# Patient Record
Sex: Male | Born: 1995 | Race: Black or African American | Hispanic: No | Marital: Single | Smoking: Current some day smoker
Health system: Southern US, Community
[De-identification: ages and names within clinical notes are randomized; demographics above are authoritative.]

---

## 2020-06-15 ENCOUNTER — Encounter (HOSPITAL_COMMUNITY): Payer: Self-pay | Admitting: Emergency Medicine

## 2020-06-15 ENCOUNTER — Emergency Department (HOSPITAL_COMMUNITY)
Admission: EM | Admit: 2020-06-15 | Discharge: 2020-06-15 | Disposition: A | Discharge status: Home / Self Care | Payer: Self-pay | Attending: Emergency Medicine | Admitting: Emergency Medicine

## 2020-06-15 ENCOUNTER — Emergency Department (HOSPITAL_COMMUNITY): Payer: Self-pay

## 2020-06-15 DIAGNOSIS — Z5321 Procedure and treatment not carried out due to patient leaving prior to being seen by health care provider: Secondary | ICD-10-CM | POA: Insufficient documentation

## 2020-06-15 DIAGNOSIS — S61213A Laceration without foreign body of left middle finger without damage to nail, initial encounter: Secondary | ICD-10-CM | POA: Insufficient documentation

## 2020-06-15 DIAGNOSIS — W28XXXA Contact with powered lawn mower, initial encounter: Secondary | ICD-10-CM | POA: Insufficient documentation

## 2020-06-15 NOTE — ED Notes (Signed)
Called for room x5 

## 2020-06-15 NOTE — ED Notes (Signed)
Called for room x3 °

## 2020-06-15 NOTE — ED Triage Notes (Signed)
Pt here with a lac to the middle finger on the right hand with a mower , bleeding controlled

## 2020-06-15 NOTE — ED Triage Notes (Signed)
Emergency Medicine Provider Triage Evaluation Note  Christopher Ho , a 25 y.o. male  was evaluated in triage.  Pt complains of right 3rd finger lac from lawn mower. Bleeding controlled, last td less than 5 years. ambidextrous   Review of Systems  Positive: Finger lac Negative: antigoagulated  Physical Exam  BP (!) 141/91 (BP Location: Left Arm)   Pulse 72   Temp 98.1 F (36.7 C) (Oral)   Resp 16   SpO2 100%  Gen:   Awake, no distress   HEENT:  Atraumatic  Resp:  Normal effort  Cardiac:  Normal rate  Abd:   Nondistended, nontender  MSK:   Moves extremities without difficulty, lac to pad of distal right 3rd digit, sensation intact, normal ROM Neuro:  Speech clear   Medical Decision Making  Medically screening exam initiated at 7:12 PM.  Appropriate orders placed.  Christopher Ho was informed that the remainder of the evaluation will be completed by another provider, this initial triage assessment does not replace that evaluation, and the importance of remaining in the ED until their evaluation is complete.  Clinical Impression     Christopher Ho 06/15/20 1914

## 2020-06-16 ENCOUNTER — Other Ambulatory Visit: Payer: Self-pay

## 2020-06-16 ENCOUNTER — Emergency Department (HOSPITAL_COMMUNITY)
Admission: EM | Admit: 2020-06-16 | Discharge: 2020-06-16 | Disposition: A | Discharge status: Home / Self Care | Payer: Medicaid - Out of State | Attending: Emergency Medicine | Admitting: Emergency Medicine

## 2020-06-16 ENCOUNTER — Encounter (HOSPITAL_COMMUNITY): Payer: Self-pay | Admitting: Emergency Medicine

## 2020-06-16 DIAGNOSIS — F17200 Nicotine dependence, unspecified, uncomplicated: Secondary | ICD-10-CM | POA: Insufficient documentation

## 2020-06-16 DIAGNOSIS — Y93A6 Activity, grass drills: Secondary | ICD-10-CM | POA: Insufficient documentation

## 2020-06-16 DIAGNOSIS — S6991XA Unspecified injury of right wrist, hand and finger(s), initial encounter: Secondary | ICD-10-CM | POA: Diagnosis present

## 2020-06-16 DIAGNOSIS — S61212A Laceration without foreign body of right middle finger without damage to nail, initial encounter: Secondary | ICD-10-CM | POA: Diagnosis not present

## 2020-06-16 DIAGNOSIS — W28XXXA Contact with powered lawn mower, initial encounter: Secondary | ICD-10-CM | POA: Insufficient documentation

## 2020-06-16 MED ORDER — LIDOCAINE HCL (PF) 1 % IJ SOLN
30.0000 mL | Freq: Once | INTRAMUSCULAR | Status: AC
  Administered 2020-06-16: 30 mL
  Filled 2020-06-16: qty 30

## 2020-06-16 MED ORDER — CEPHALEXIN 500 MG PO CAPS: 500.0000 mg | ORAL_CAPSULE | Freq: Two times a day (BID) | ORAL | 0 refills | Status: AC

## 2020-06-16 NOTE — ED Provider Notes (Signed)
[facility]-EMERGENCY DEPT Provider Note   CSN: [id number] Arrival date & time: 06/16/20  1205     History No chief complaint on file.   Christopher Ho is a 25 y.o. male with no pertinent past medical history that presents to the emergency department today for finger laceration.  Patient states that he cut his right middle finger yesterday while mowing grass, states that he was able to continue mowing grass after he cut his finger.  Patient states that he is up-to-date on his tetanus shot.  Patient did not clean the wound out, states that he came last night but then left without being seen.  Bleeding of been controlled, is not on any blood thinners.  States that he cut himself with the lawnmower, did not go through the nail bed.  Did not injure himself elsewhere.  No numbness and tingling into his finger.  Normal range of motion to finger.  No other complaints at this time.Marland Kitchen  HPI     History reviewed. No pertinent past medical history.  There are no problems to display for this patient.   History reviewed. No pertinent surgical history.     No family history on file.  Social History   Tobacco Use  . Smoking status: Current Some Day Smoker    Home Medications Prior to Admission medications   Medication Sig Start Date End Date Taking? Authorizing Provider  cephALEXin (KEFLEX) 500 MG capsule Take 1 capsule (500 mg total) by mouth 2 (two) times daily for 5 days. 06/16/20 06/21/20 Yes Farrel Gordon, PA-C    Allergies    Shellfish allergy  Review of Systems   Review of Systems  Constitutional: Negative for diaphoresis, fatigue and fever.  Eyes: Negative for visual disturbance.  Respiratory: Negative for shortness of breath.   Cardiovascular: Negative for chest pain.  Gastrointestinal: Negative for nausea and vomiting.  Musculoskeletal: Negative for back pain and myalgias.  Skin: Positive for wound. Negative for color change, pallor and rash.   Neurological: Negative for syncope, weakness, light-headedness, numbness and headaches.  Psychiatric/Behavioral: Negative for behavioral problems and confusion.    Physical Exam Updated Vital Signs BP 132/90 (BP Location: Right Arm)   Pulse 74   Temp 98.3 F (36.8 C) (Oral)   Resp 18   Ht 6\' 3"  (1.905 m)   Wt 79.4 kg   SpO2 100%   BMI 21.87 kg/m   Physical Exam Constitutional:      General: He is not in acute distress.    Appearance: Normal appearance. He is not ill-appearing, toxic-appearing or diaphoretic.  Cardiovascular:     Rate and Rhythm: Normal rate and regular rhythm.     Pulses: Normal pulses.  Pulmonary:     Effort: Pulmonary effort is normal.     Breath sounds: Normal breath sounds.  Musculoskeletal:        General: Normal range of motion.       Hands:     Comments: 2 cm laceration to middle tip of finger, bleeding controlled. Normal cap refill. Superficial. Normal strength and ROM to DIP, PIP and MCP joint. Radial pulse 2+. No nailbed involvement.  Skin:    General: Skin is warm and dry.     Capillary Refill: Capillary refill takes less than 2 seconds.  Neurological:     General: No focal deficit present.     Mental Status: He is alert and oriented to person, place, and time.  Psychiatric:        Mood  and Affect: Mood normal.        Behavior: Behavior normal.        Thought Content: Thought content normal.     ED Results / Procedures / Treatments   Labs (all labs ordered are listed, but only abnormal results are displayed) Labs Reviewed - No data to display  EKG None  Radiology DG Finger Middle Right  Result Date: 06/15/2020 CLINICAL DATA:  Lawnmower injury EXAM: RIGHT MIDDLE FINGER 2+V COMPARISON:  None. FINDINGS: There is no evidence of fracture or dislocation. Cutaneous skin defect with edema along the distal aspect of the phalanx. No radiopaque foreign body. IMPRESSION: No acute fracture or dislocation. Cutaneous skin defect and edema along  the distal aspect of the phalanx, no radiopaque foreign body. Electronically Signed   By: Maudry Mayhew MD   On: 06/15/2020 19:53    Procedures .Marland KitchenLaceration Repair  Date/Time: 06/16/2020 12:20 PM Performed by: Farrel Gordon, PA-C Authorized by: Farrel Gordon, PA-C   Consent:    Consent obtained:  Verbal   Consent given by:  Patient   Risks discussed:  Infection, need for additional repair, pain, poor cosmetic result and poor wound healing   Alternatives discussed:  No treatment and delayed treatment Universal protocol:    Procedure explained and questions answered to patient or proxy's satisfaction: yes     Relevant documents present and verified: yes     Test results available: yes     Imaging studies available: yes     Required blood products, implants, devices, and special equipment available: yes     Site/side marked: yes     Immediately prior to procedure, a time out was called: yes     Patient identity confirmed:  Verbally with patient Anesthesia:    Anesthesia method:  Nerve block   Block anesthetic:  Lidocaine 1% w/o epi   Block technique:  Finger block   Block injection procedure:  Anatomic landmarks identified, introduced needle, anatomic landmarks palpated, negative aspiration for blood and incremental injection   Block outcome:  Anesthesia achieved Laceration details:    Location:  Finger   Finger location:  R long finger   Length (cm):  2   Depth (mm):  2 Pre-procedure details:    Preparation:  Patient was prepped and draped in usual sterile fashion Exploration:    Hemostasis achieved with:  Direct pressure   Wound exploration: wound explored through full range of motion and entire depth of wound visualized   Treatment:    Area cleansed with:  Povidone-iodine and soap and water   Amount of cleaning:  Extensive   Irrigation method:  Pressure wash and tap   Debridement:  None Skin repair:    Repair method:  Sutures   Suture size:  4-0   Suture material:   Prolene   Suture technique:  Simple interrupted   Number of sutures:  8 Approximation:    Approximation:  Close Repair type:    Repair type:  Simple Post-procedure details:    Dressing:  Open (no dressing)   Procedure completion:  Tolerated well, no immediate complications     Medications Ordered in ED Medications  lidocaine (PF) (XYLOCAINE) 1 % injection 30 mL (has no administration in time range)    ED Course  I have reviewed the triage vital signs and the nursing notes.  Christopher Ho is a 25 y.o. male with no pertinent past medical history that presents to the emergency department today for finger laceration.  Patient is distally  neurovascularly intact, bleeding controlled.  No tendon involvement.  Wound was irrigated very well.  Wound was repaired with 8 sutures, patient tolerated well.  Will place on antibiotics due to location of wound.  Symptomatic treatment discussed, patient will return for wound check/suture removal in the next couple of days.  Patient is up-to-date on tetanus.  Doubt need for further emergent work up at this time. I explained the diagnosis and have given explicit precautions to return to the ER including for any other new or worsening symptoms. The patient understands and accepts the medical plan as it's been dictated and I have answered their questions. Discharge instructions concerning home care and prescriptions have been given. The patient is STABLE and is discharged to home in good condition.   Final Clinical Impression(s) / ED Diagnoses Final diagnoses:  Laceration of right middle finger without damage to nail, foreign body presence unspecified, initial encounter    Rx / DC Orders ED Discharge Orders         Ordered    cephALEXin (KEFLEX) 500 MG capsule  2 times daily        06/16/20 1308           Farrel Gordon, PA-C 06/16/20 1310    Hurley, Madelaine Bhat, DO 06/16/20 1553

## 2020-06-16 NOTE — ED Notes (Signed)
Wound spray applied, wound cleaned. No nailbed involvement, only finger pad laceration.

## 2020-06-16 NOTE — ED Triage Notes (Signed)
Patient complains of a laceration to middle right finger cutting grass yesterday, stated that he is UTD on tetanus, got it last year.

## 2020-06-16 NOTE — Discharge Instructions (Addendum)
WOUND CARE Please return in7 days to have your stitches/staples removed or sooner if you have concerns.  Keep area clean and dry for 24 hours. Do not remove bandage, if applied.  After 24 hours, remove bandage and wash wound gently with mild soap and warm water. Reapply a new bandage after cleaning wound, if directed.  Continue daily cleansing with soap and water until stitches/staples are removed.  Do not apply any ointments or creams to the wound while stitches/staples are in place, as this may cause delayed healing.  Notify the office if you experience any of the following signs of infection: Swelling, redness, pus drainage, streaking, fever >101.0 F  Notify the office if you experience excessive bleeding that does not stop after 15-20 minutes of constant, firm pressure.       Please return to the Emergency Department if you experience any worsening of your condition.  Thank you for allowing [place] to be a part of your care. Please speak to your pharmacist about any new medications prescribed today in regards to side effects or interactions with other medications.

## 2020-06-24 ENCOUNTER — Other Ambulatory Visit: Payer: Self-pay

## 2020-06-24 ENCOUNTER — Emergency Department (HOSPITAL_COMMUNITY)
Admission: EM | Admit: 2020-06-24 | Discharge: 2020-06-24 | Disposition: A | Discharge status: Home / Self Care | Payer: Medicaid - Out of State | Attending: Emergency Medicine | Admitting: Emergency Medicine

## 2020-06-24 ENCOUNTER — Encounter (HOSPITAL_COMMUNITY): Payer: Self-pay | Admitting: Emergency Medicine

## 2020-06-24 DIAGNOSIS — L089 Local infection of the skin and subcutaneous tissue, unspecified: Secondary | ICD-10-CM | POA: Diagnosis not present

## 2020-06-24 DIAGNOSIS — S61212D Laceration without foreign body of right middle finger without damage to nail, subsequent encounter: Secondary | ICD-10-CM | POA: Insufficient documentation

## 2020-06-24 DIAGNOSIS — X58XXXD Exposure to other specified factors, subsequent encounter: Secondary | ICD-10-CM | POA: Diagnosis not present

## 2020-06-24 DIAGNOSIS — F17200 Nicotine dependence, unspecified, uncomplicated: Secondary | ICD-10-CM | POA: Insufficient documentation

## 2020-06-24 DIAGNOSIS — L7682 Other postprocedural complications of skin and subcutaneous tissue: Secondary | ICD-10-CM | POA: Diagnosis not present

## 2020-06-24 DIAGNOSIS — Z4802 Encounter for removal of sutures: Secondary | ICD-10-CM | POA: Diagnosis not present

## 2020-06-24 MED ORDER — DOXYCYCLINE HYCLATE 100 MG PO CAPS: 100.0000 mg | ORAL_CAPSULE | Freq: Two times a day (BID) | ORAL | 0 refills | Status: AC

## 2020-06-24 MED ORDER — LIDOCAINE HCL (PF) 1 % IJ SOLN
2.0000 mL | Freq: Once | INTRAMUSCULAR | Status: AC
  Administered 2020-06-24: 2 mL
  Filled 2020-06-24: qty 30

## 2020-06-24 NOTE — Discharge Instructions (Addendum)
Please cleanse wound with gentle soap and water daily and apply Neosporin over the wound for protection.  Take antibiotic as prescribed.  Keep a dressing over the wound to decrease risk of worsening infection.  Return if you have any concern

## 2020-06-24 NOTE — ED Provider Notes (Signed)
[facility]-EMERGENCY DEPT Provider Note   CSN: [id number] Arrival date & time: 06/24/20  1500     History No chief complaint on file.   Pistol Kessenich is a 25 y.o. male.  The history is provided by the patient and medical records. No language interpreter was used.     25 year old male presenting requesting for wound recheck.  Patient injured his right middle finger on 06/16/2020 while he was mowing the yard.  He stopped the next day to have wound care and laceration repaired.  He was discharged home, he noticed increasing swelling and pain to the affected area and also report that some of the sutures popped out.  Pain is sharp throbbing moderate intensity.  No fever chills or numbness.  He is right-hand dominant  No past medical history on file.  There are no problems to display for this patient.   No past surgical history on file.     No family history on file.  Social History   Tobacco Use  . Smoking status: Current Some Day Smoker    Home Medications Prior to Admission medications   Not on File    Allergies    Shellfish allergy  Review of Systems   Review of Systems  Constitutional: Negative for fever.  Skin: Positive for wound.    Physical Exam Updated Vital Signs BP 137/78 (BP Location: Left Arm)   Pulse (!) 46   Temp 99 F (37.2 C) (Oral)   Resp 16   SpO2 99%   Physical Exam Vitals and nursing note reviewed.  Constitutional:      General: He is not in acute distress.    Appearance: He is well-developed.  HENT:     Head: Atraumatic.  Eyes:     Conjunctiva/sclera: Conjunctivae normal.  Musculoskeletal:        General: Signs of injury (Right middle finger: There is a 2 cm laceration noted to the pad of the finger with several sutures in place however the wound is oozing discharge and is edematous.  Tender to palpation) present.     Cervical back: Neck supple.  Skin:    Findings: No rash.  Neurological:     Mental Status:  He is alert.     ED Results / Procedures / Treatments   Labs (all labs ordered are listed, but only abnormal results are displayed) Labs Reviewed - No data to display  EKG None  Radiology No results found.  Procedures Procedures   SUTURE REMOVAL Performed by: Fayrene Helper  Consent: Verbal consent obtained. Patient identity confirmed: provided demographic data Time out: Immediately prior to procedure a "time out" was called to verify the correct patient, procedure, equipment, support staff and site/side marked as required.  Location details: R middle finger, pad  Wound Appearance: edematous, tender, and infected with several sutures that are loose  Sutures/Staples Removed: sutures removed  Facility: sutures placed in this facility Patient tolerance: I performed digital nerve block using lidocaine 1% w/o epi, a total of 76ml.  I trimmed off necrotic tissues using sterile scissor.  I irrigate the wound copiously, applied zeroform gauze, 4x4 gauze and coban to protect the wound.       Medications Ordered in ED Medications - No data to display  ED Course  I have reviewed the triage vital signs and the nursing notes.  Pertinent labs & imaging results that were available during my care of the patient were reviewed by me and considered in my medical decision  making (see chart for details).    MDM Rules/Calculators/A&P                          BP 137/78 (BP Location: Left Arm)   Pulse (!) 46   Temp 99 F (37.2 C) (Oral)   Resp 16   Ht 6\' 3"  (1.905 m)   Wt 79.4 kg   SpO2 99%   BMI 21.87 kg/m   Final Clinical Impression(s) / ED Diagnoses Final diagnoses:  Laceration of finger with infection, initial encounter    Rx / DC Orders ED Discharge Orders         Ordered    doxycycline (VIBRAMYCIN) 100 MG capsule  2 times daily        06/24/20 1559         3:57 PM Here for wound reassessment and suture removal on a laceration on his right middle finger on  06/16/2020.  The wound appears infected with necrotic tissue and oozing some fluid.  I performed digital nerve block, irrigate the wound, trim of dead tissue, and cleans wound.  Dressing placed.  Will discharge home with antibiotic and return precaution.  Sutures were removed as well   06/18/2020, PA-C 06/24/20 1600    08/24/20, MD 06/25/20 0001

## 2020-06-24 NOTE — ED Triage Notes (Signed)
Patient presents with a request to have the laceration on his finger checked. He re injured the finger and believes he needs to be re stiched.

## 2022-08-13 IMAGING — DX DG FINGER MIDDLE 2+V*R*
3 series · 3 of 3 positions shown · non-contrast
Comparison: None.

CLINICAL DATA: Lawnmower injury

EXAM:
RIGHT MIDDLE FINGER 2+V

[finger ap]
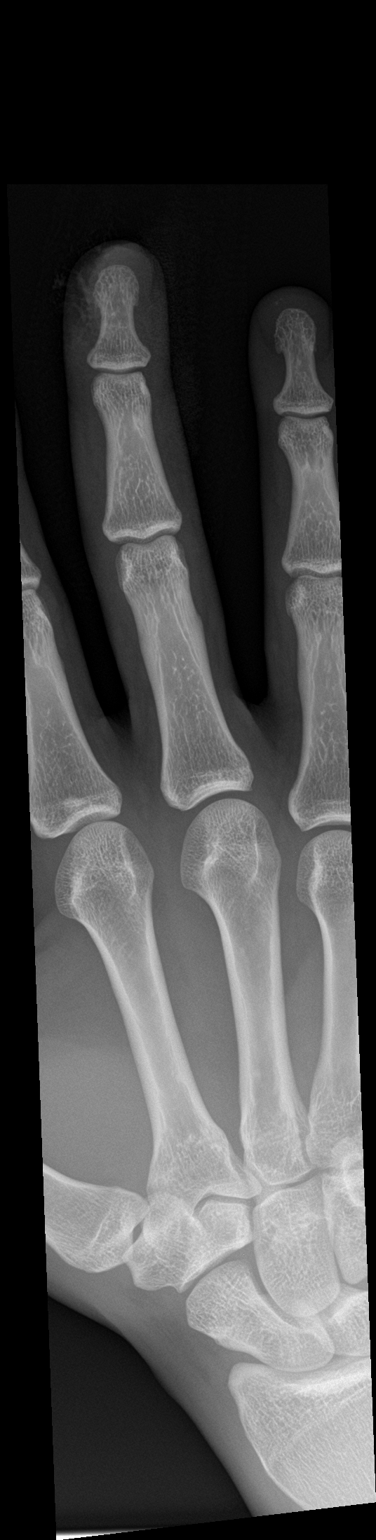

[finger obl]
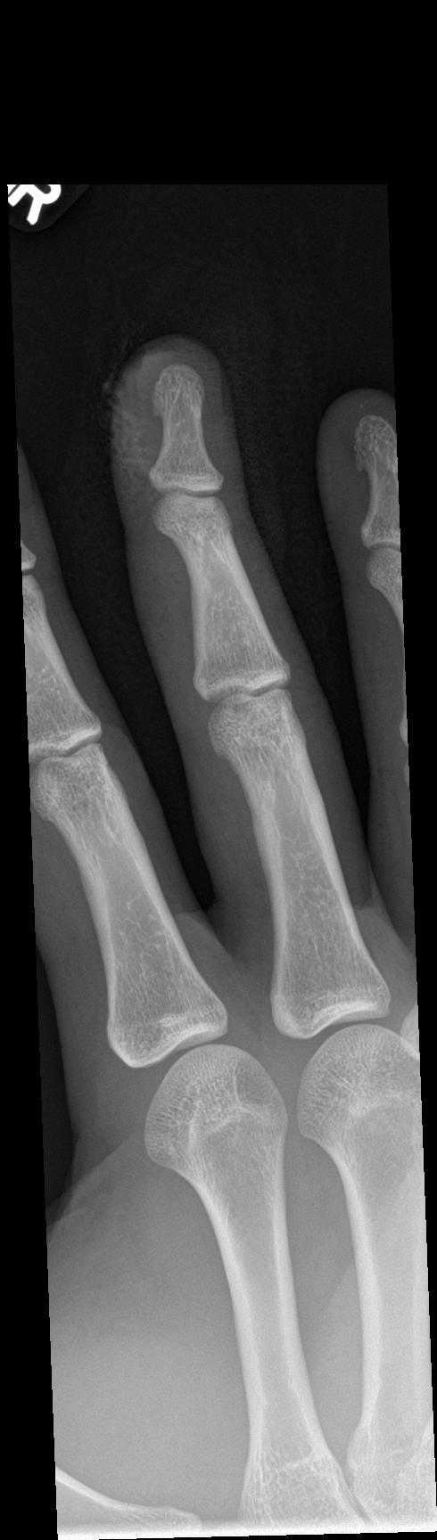

[finger lat]
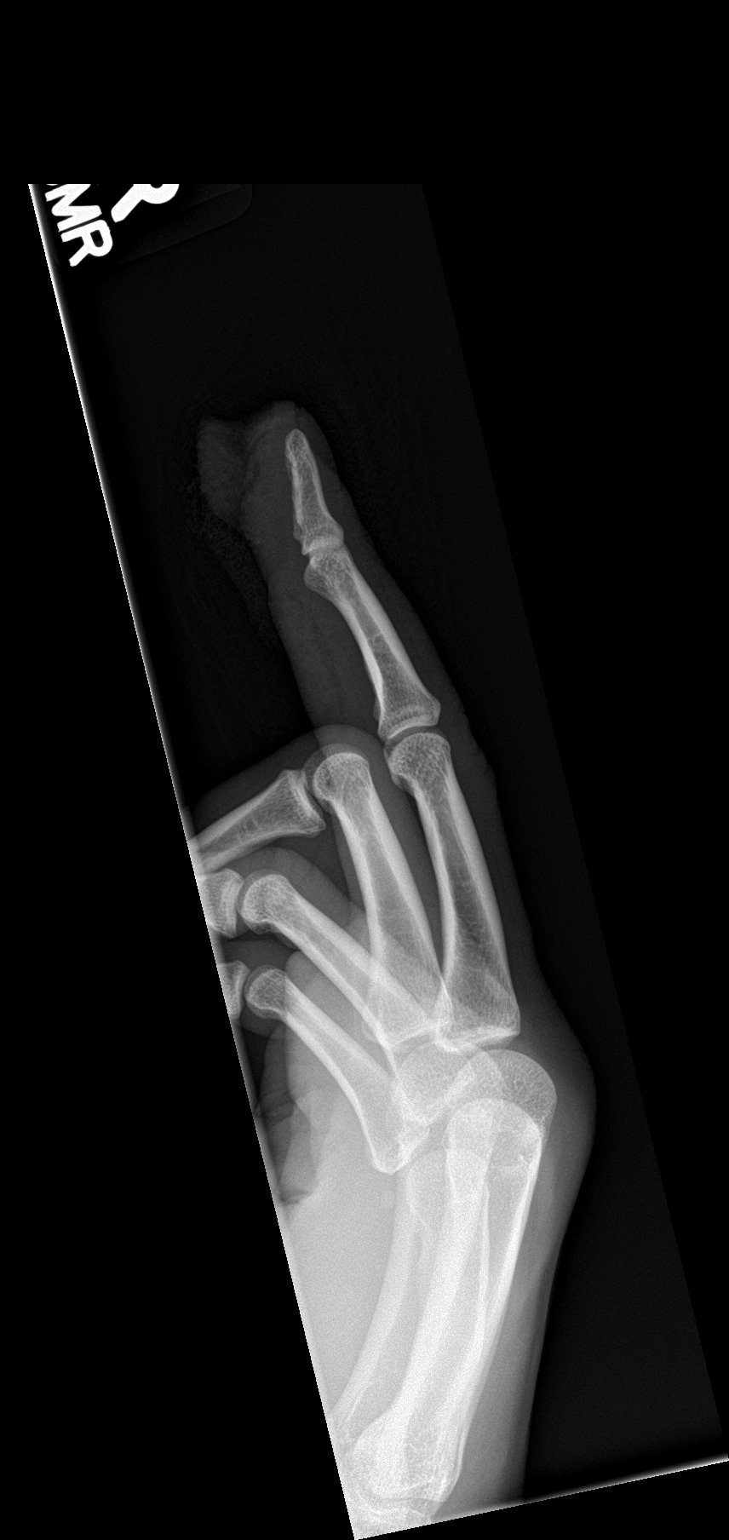

[3 of 3 positions shown; findings below may reference images not displayed]

FINDINGS: There is no evidence of fracture or dislocation. Cutaneous skin
defect with edema along the distal aspect of the phalanx. No
radiopaque foreign body.
IMPRESSION: No acute fracture or dislocation. Cutaneous skin defect and edema
along the distal aspect of the phalanx, no radiopaque foreign body.
